# Patient Record
Sex: Female | Born: 1940 | Race: White | Hispanic: No | Smoking: Current every day smoker
Health system: Southern US, Community
[De-identification: ages and names within clinical notes are randomized; demographics above are authoritative.]

## PROBLEM LIST (undated history)

## (undated) DIAGNOSIS — C50919 Malignant neoplasm of unspecified site of unspecified female breast: Secondary | ICD-10-CM

## (undated) HISTORY — PX: TONSILLECTOMY: SUR1361

## (undated) HISTORY — PX: MASTECTOMY: SHX3

## (undated) HISTORY — PX: ESOPHAGEAL DILATION: SHX303

---

## 2015-09-05 ENCOUNTER — Emergency Department (HOSPITAL_BASED_OUTPATIENT_CLINIC_OR_DEPARTMENT_OTHER)
Admission: EM | Admit: 2015-09-05 | Discharge: 2015-09-06 | Disposition: A | Discharge status: Home / Self Care | Payer: Medicare Other | Attending: Emergency Medicine | Admitting: Emergency Medicine

## 2015-09-05 ENCOUNTER — Emergency Department (HOSPITAL_BASED_OUTPATIENT_CLINIC_OR_DEPARTMENT_OTHER): Payer: Medicare Other

## 2015-09-05 ENCOUNTER — Encounter (HOSPITAL_BASED_OUTPATIENT_CLINIC_OR_DEPARTMENT_OTHER): Payer: Self-pay | Admitting: Emergency Medicine

## 2015-09-05 DIAGNOSIS — S61219A Laceration without foreign body of unspecified finger without damage to nail, initial encounter: Secondary | ICD-10-CM

## 2015-09-05 DIAGNOSIS — Y939 Activity, unspecified: Secondary | ICD-10-CM | POA: Insufficient documentation

## 2015-09-05 DIAGNOSIS — Y999 Unspecified external cause status: Secondary | ICD-10-CM | POA: Diagnosis not present

## 2015-09-05 DIAGNOSIS — S61213A Laceration without foreign body of left middle finger without damage to nail, initial encounter: Secondary | ICD-10-CM | POA: Insufficient documentation

## 2015-09-05 DIAGNOSIS — Z853 Personal history of malignant neoplasm of breast: Secondary | ICD-10-CM | POA: Insufficient documentation

## 2015-09-05 DIAGNOSIS — F17200 Nicotine dependence, unspecified, uncomplicated: Secondary | ICD-10-CM | POA: Diagnosis not present

## 2015-09-05 DIAGNOSIS — Y92009 Unspecified place in unspecified non-institutional (private) residence as the place of occurrence of the external cause: Secondary | ICD-10-CM | POA: Diagnosis not present

## 2015-09-05 DIAGNOSIS — W230XXA Caught, crushed, jammed, or pinched between moving objects, initial encounter: Secondary | ICD-10-CM | POA: Insufficient documentation

## 2015-09-05 DIAGNOSIS — S6992XA Unspecified injury of left wrist, hand and finger(s), initial encounter: Secondary | ICD-10-CM | POA: Diagnosis present

## 2015-09-05 DIAGNOSIS — T1490XA Injury, unspecified, initial encounter: Secondary | ICD-10-CM

## 2015-09-05 HISTORY — DX: Malignant neoplasm of unspecified site of unspecified female breast: C50.919

## 2015-09-05 MED ORDER — NAPROXEN 250 MG PO TABS
500.0000 mg | ORAL_TABLET | Freq: Once | ORAL | Status: AC
  Administered 2015-09-05: 500 mg via ORAL
  Filled 2015-09-05: qty 2

## 2015-09-05 MED ORDER — TETANUS-DIPHTH-ACELL PERTUSSIS 5-2.5-18.5 LF-MCG/0.5 IM SUSP
0.5000 mL | Freq: Once | INTRAMUSCULAR | Status: AC
  Administered 2015-09-05: 0.5 mL via INTRAMUSCULAR
  Filled 2015-09-05: qty 0.5

## 2015-09-05 NOTE — ED Provider Notes (Signed)
CSN: [id number]     Arrival date & time 09/05/15  2057 History  By signing my name below, I, Tobe Sos, attest that this documentation has been prepared under the direction and in the presence of Rue Valladares, MD.  Electronically Signed: Tobe Sos, ED Scribe. 09/05/2015. 11:23 PM.   Chief Complaint  Patient presents with  . Finger Injury   Patient is a 75 y.o. female presenting with hand pain. The history is provided by the patient. No language interpreter was used.  Hand Pain This is a new problem. The current episode started 3 to 5 hours ago. The problem occurs constantly. The problem has not changed since onset.Pertinent negatives include no chest pain and no abdominal pain. Nothing aggravates the symptoms. Nothing relieves the symptoms. She has tried nothing for the symptoms. The treatment provided no relief.   HPI Comments: Cheyenne Williams is a 75 y.o. female who presents to the Emergency Department complaining of gradually worsening left third digit pain s/p slamming her finger in a door PTA. Pt has associated .5cm laceration and ecchymosis to site of injury. Bleeding is controlled. Tetanus is not UTD. Pt denies taking OTC medications PTA.  Past Medical History  Diagnosis Date  . Breast CA [facility])    Past Surgical History  Procedure Laterality Date  . Mastectomy    . Tonsillectomy    . Esophageal dilation     No family history on file. Social History  Substance Use Topics  . Smoking status: Current Every Day Smoker  . Smokeless tobacco: None  . Alcohol Use: Yes   OB History    No data available     Review of Systems  Constitutional: Negative for fever.  Cardiovascular: Negative for chest pain.  Gastrointestinal: Negative for abdominal pain.  Skin: Positive for wound.       Middle third digit L Hand wound and edema.   Neurological: Negative for weakness.  All other systems reviewed and are negative.   Allergies  Penicillins  Home Medications   Prior to  Admission medications   Not on File   BP 139/70 mmHg  Pulse 74  Temp(Src) 98.7 F (37.1 C) (Oral)  Resp 18  Ht 5\' 1"  (1.549 m)  Wt 124 lb (56.246 kg)  BMI 23.44 kg/m2  SpO2 99%   Physical Exam  Constitutional: She is oriented to person, place, and time. She appears well-developed and well-nourished.  HENT:  Head: Normocephalic and atraumatic.  Mouth/Throat: Oropharynx is clear and moist. No oropharyngeal exudate.  Eyes: Conjunctivae and EOM are normal. Pupils are equal, round, and reactive to light.  Neck: Normal range of motion. Neck supple. No JVD present. No tracheal deviation present.  Cardiovascular: Normal rate, regular rhythm and normal heart sounds.  Exam reveals no gallop and no friction rub.   No murmur heard. RRR.   Pulmonary/Chest: Effort normal and breath sounds normal. No stridor. No respiratory distress. She has no wheezes. She has no rales.  Lungs CTA bilaterally.   Abdominal: Soft. Bowel sounds are normal. She exhibits no distension.  Musculoskeletal: Normal range of motion.       Arms: Middle third L hand digit wound. N/V intact  Lymphadenopathy:    She has no cervical adenopathy.  Neurological: She is alert and oriented to person, place, and time. She has normal reflexes.  Skin: Skin is warm and dry.     Psychiatric: She has a normal mood and affect.  Nursing note and vitals reviewed.   ED Course  Procedures (including critical care time)  DIAGNOSTIC STUDIES: Oxygen Saturation is 99% on RA, normal by my interpretation.   COORDINATION OF CARE: 11:19 PM-Discussed next steps with pt including wound irrigation, Naproxen and Dermabond repair. Pt verbalized understanding and is agreeable with the plan.   Labs Review Labs Reviewed - No data to display  Imaging Review Dg Finger Middle Left  09/05/2015  CLINICAL DATA:  Acute left middle finger injury in house door. Initial encounter. EXAM: LEFT MIDDLE FINGER 2+V COMPARISON:  None. FINDINGS: There is no  evidence of acute fracture, subluxation or dislocation. No soft tissue abnormalities noted. No unexpected radiopaque foreign body is present. IMPRESSION: No acute bony abnormalities. Electronically Signed   By: Margarette Canada M.D.   On: 09/05/2015 21:31   I have personally reviewed and evaluated these images and lab results as part of my medical decision-making.   EKG Interpretation None      LACERATION REPAIR PROCEDURE NOTE The patient's identification was confirmed and consent was obtained. This procedure was performed by Kymere Fullington, MD at 12:18 AM. Site: L third digit Sterile procedures observed Anesthetic used (type and amt): none Length:22mm # of Sutures: Dermabond Technique:Dermabond Complexity: Complex as nail was clipped Antibx ointment applied Tetanus ordered Site anesthetized, nail was trimmed to bed, irrigated with NS, explored without evidence of foreign body, wound well approximated, site covered with dry, sterile dressing.  Patient tolerated procedure well without complications. Instructions for care discussed verbally and patient provided with additional written instructions for homecare and f/u.  MDM   Final diagnoses:  Injury   Filed Vitals:   09/05/15 2300 09/06/15 0030  BP: 139/70 135/75  Pulse: 74 67  Temp:    Resp: 18 18    No results found for this or any previous visit. Dg Finger Middle Left  09/05/2015  CLINICAL DATA:  Acute left middle finger injury in house door. Initial encounter. EXAM: LEFT MIDDLE FINGER 2+V COMPARISON:  None. FINDINGS: There is no evidence of acute fracture, subluxation or dislocation. No soft tissue abnormalities noted. No unexpected radiopaque foreign body is present. IMPRESSION: No acute bony abnormalities. Electronically Signed   By: Margarette Canada M.D.   On: 09/05/2015 21:31   Medications  Tdap (BOOSTRIX) injection 0.5 mL (0.5 mLs Intramuscular Given 09/05/15 2331)  naproxen (NAPROSYN) tablet 500 mg (500 mg Oral Given 09/05/15  2331)     Tiny flap of skin at tip of finger. Strict return precautions given.    I personally performed the services described in this documentation, which was scribed in my presence. The recorded information has been reviewed and is accurate.      Veatrice Kells, MD 09/06/15 [phone]

## 2015-09-05 NOTE — ED Notes (Signed)
Dermabond and nail cutter at bedside pt currently has injuried finger in betadine soak

## 2015-09-05 NOTE — ED Notes (Signed)
Pt reports slamming left middle finger in house door. Pt reports laceration, bandage in place with bleeding controlled. Bandaged not removed in triage.

## 2015-09-06 ENCOUNTER — Encounter (HOSPITAL_BASED_OUTPATIENT_CLINIC_OR_DEPARTMENT_OTHER): Payer: Self-pay | Admitting: Emergency Medicine

## 2015-09-06 NOTE — Discharge Instructions (Signed)

## 2018-01-05 IMAGING — CR DG FINGER MIDDLE 2+V*L*
3 series · 3 of 3 positions shown · non-contrast
Comparison: None.

CLINICAL DATA: Acute left middle finger injury in house door.
Initial encounter.

EXAM:
LEFT MIDDLE FINGER 2+V

[x finger pa left]
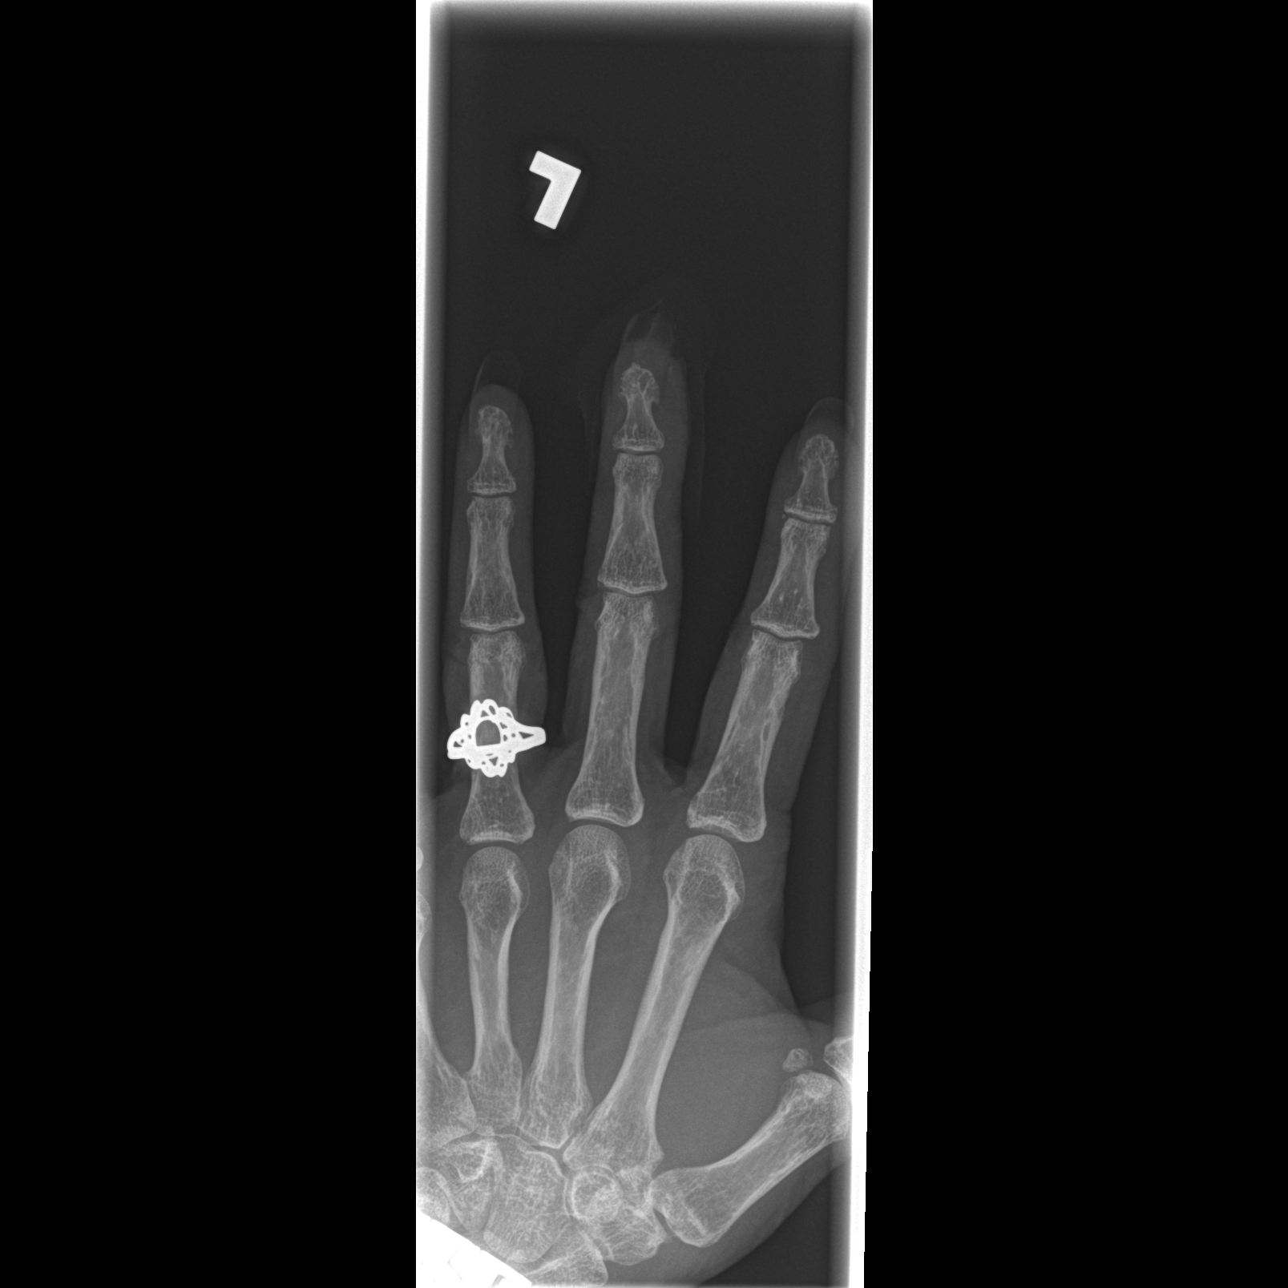

[x finger obl. left]
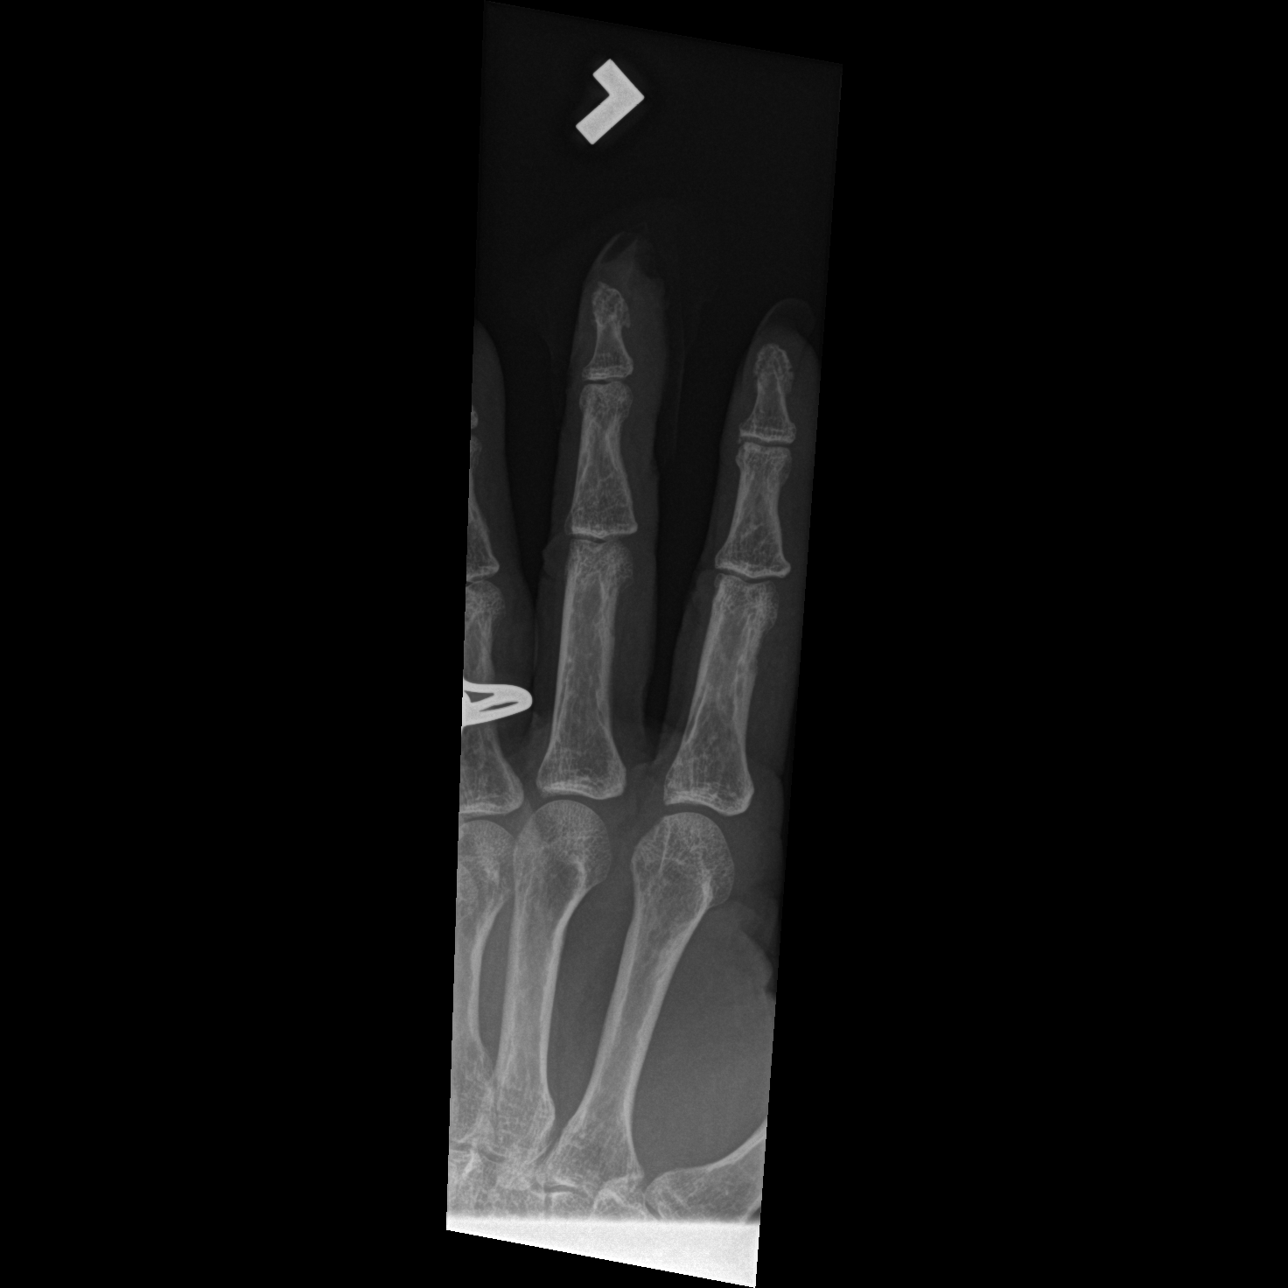

[x finger lateral left]
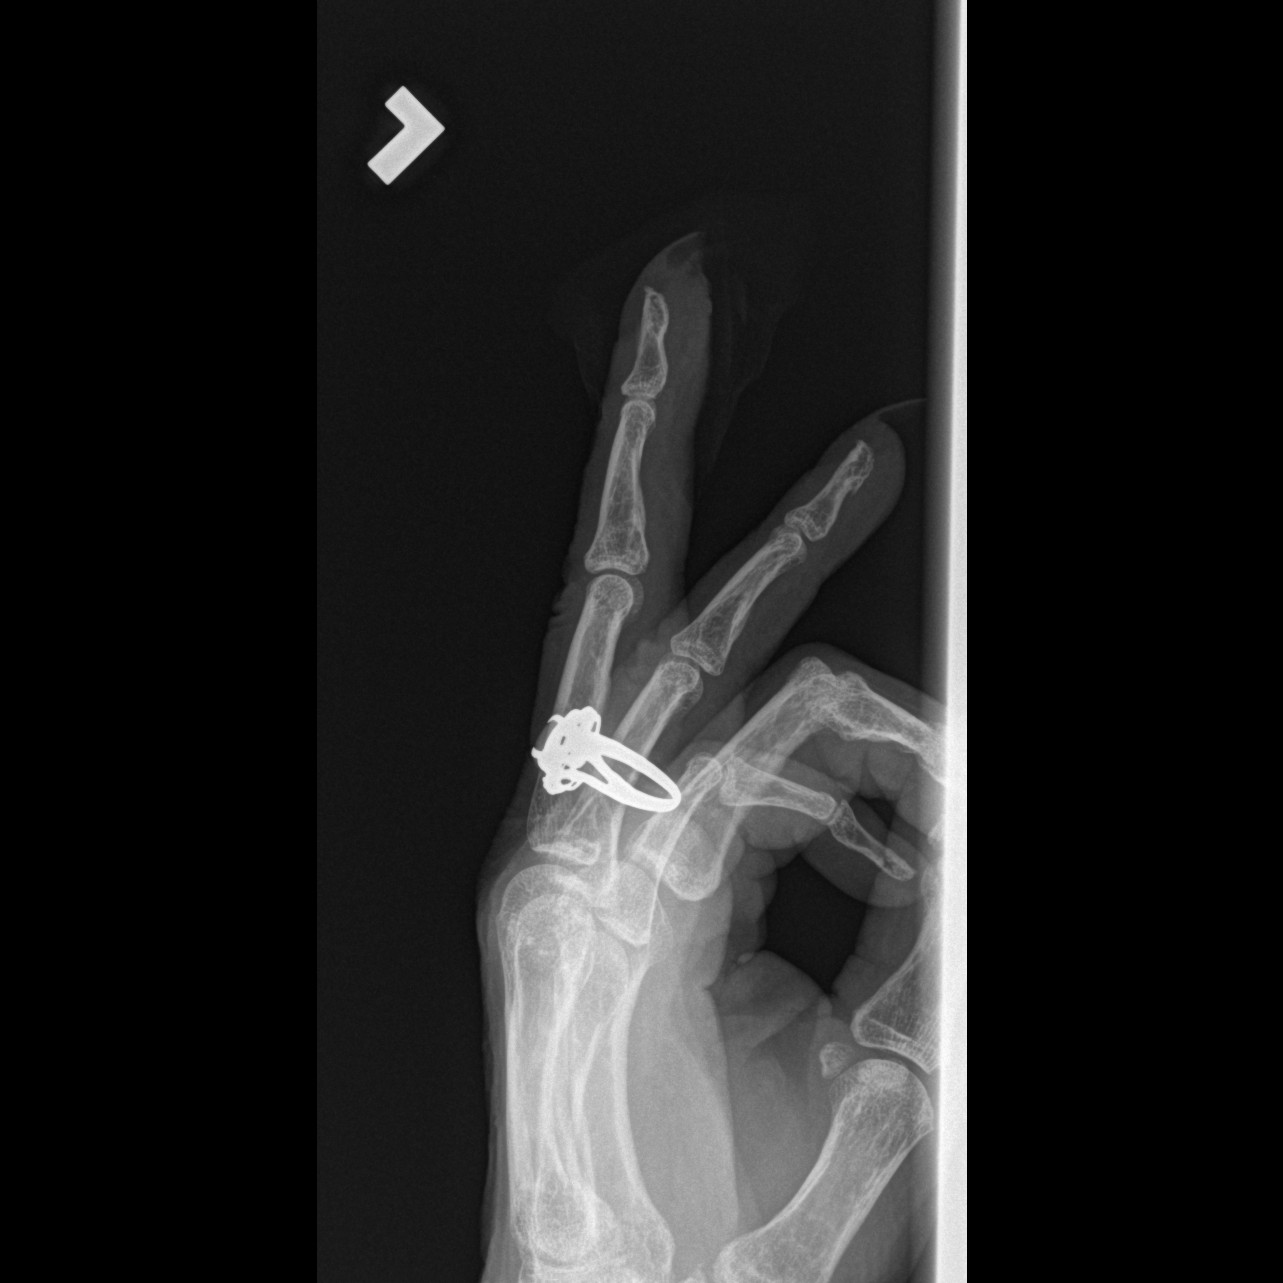

[3 of 3 positions shown; findings below may reference images not displayed]

FINDINGS: There is no evidence of acute fracture, subluxation or dislocation.

No soft tissue abnormalities noted.

No unexpected radiopaque foreign body is present.
IMPRESSION: No acute bony abnormalities.

## 2023-11-03 DEATH — deceased
